# Patient Record
Sex: Male | Born: 1995 | Race: White | Hispanic: No | Marital: Single | Smoking: Light tobacco smoker
Health system: Southern US, Community
[De-identification: ages and names within clinical notes are randomized; demographics above are authoritative.]

## PROBLEM LIST (undated history)

## (undated) ENCOUNTER — Ambulatory Visit: Admission: EM | Payer: Self-pay | Source: Home / Self Care

## (undated) DIAGNOSIS — K219 Gastro-esophageal reflux disease without esophagitis: Secondary | ICD-10-CM

## (undated) DIAGNOSIS — G8929 Other chronic pain: Secondary | ICD-10-CM

## (undated) DIAGNOSIS — M549 Dorsalgia, unspecified: Secondary | ICD-10-CM

## (undated) DIAGNOSIS — B9562 Methicillin resistant Staphylococcus aureus infection as the cause of diseases classified elsewhere: Secondary | ICD-10-CM

## (undated) DIAGNOSIS — L0390 Cellulitis, unspecified: Secondary | ICD-10-CM

## (undated) HISTORY — DX: Gastro-esophageal reflux disease without esophagitis: K21.9

## (undated) HISTORY — DX: Cellulitis, unspecified: L03.90

## (undated) HISTORY — DX: Other chronic pain: G89.29

## (undated) HISTORY — DX: Dorsalgia, unspecified: M54.9

## (undated) HISTORY — DX: Methicillin resistant Staphylococcus aureus infection as the cause of diseases classified elsewhere: B95.62

---
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
## 2013-05-31 IMAGING — CR DG CHEST 2V
2 series · 2 of 2 positions shown · non-contrast
Comparison: None.

CLINICAL DATA: Chest pain

CHEST - 2 VIEW

[w chest pa]
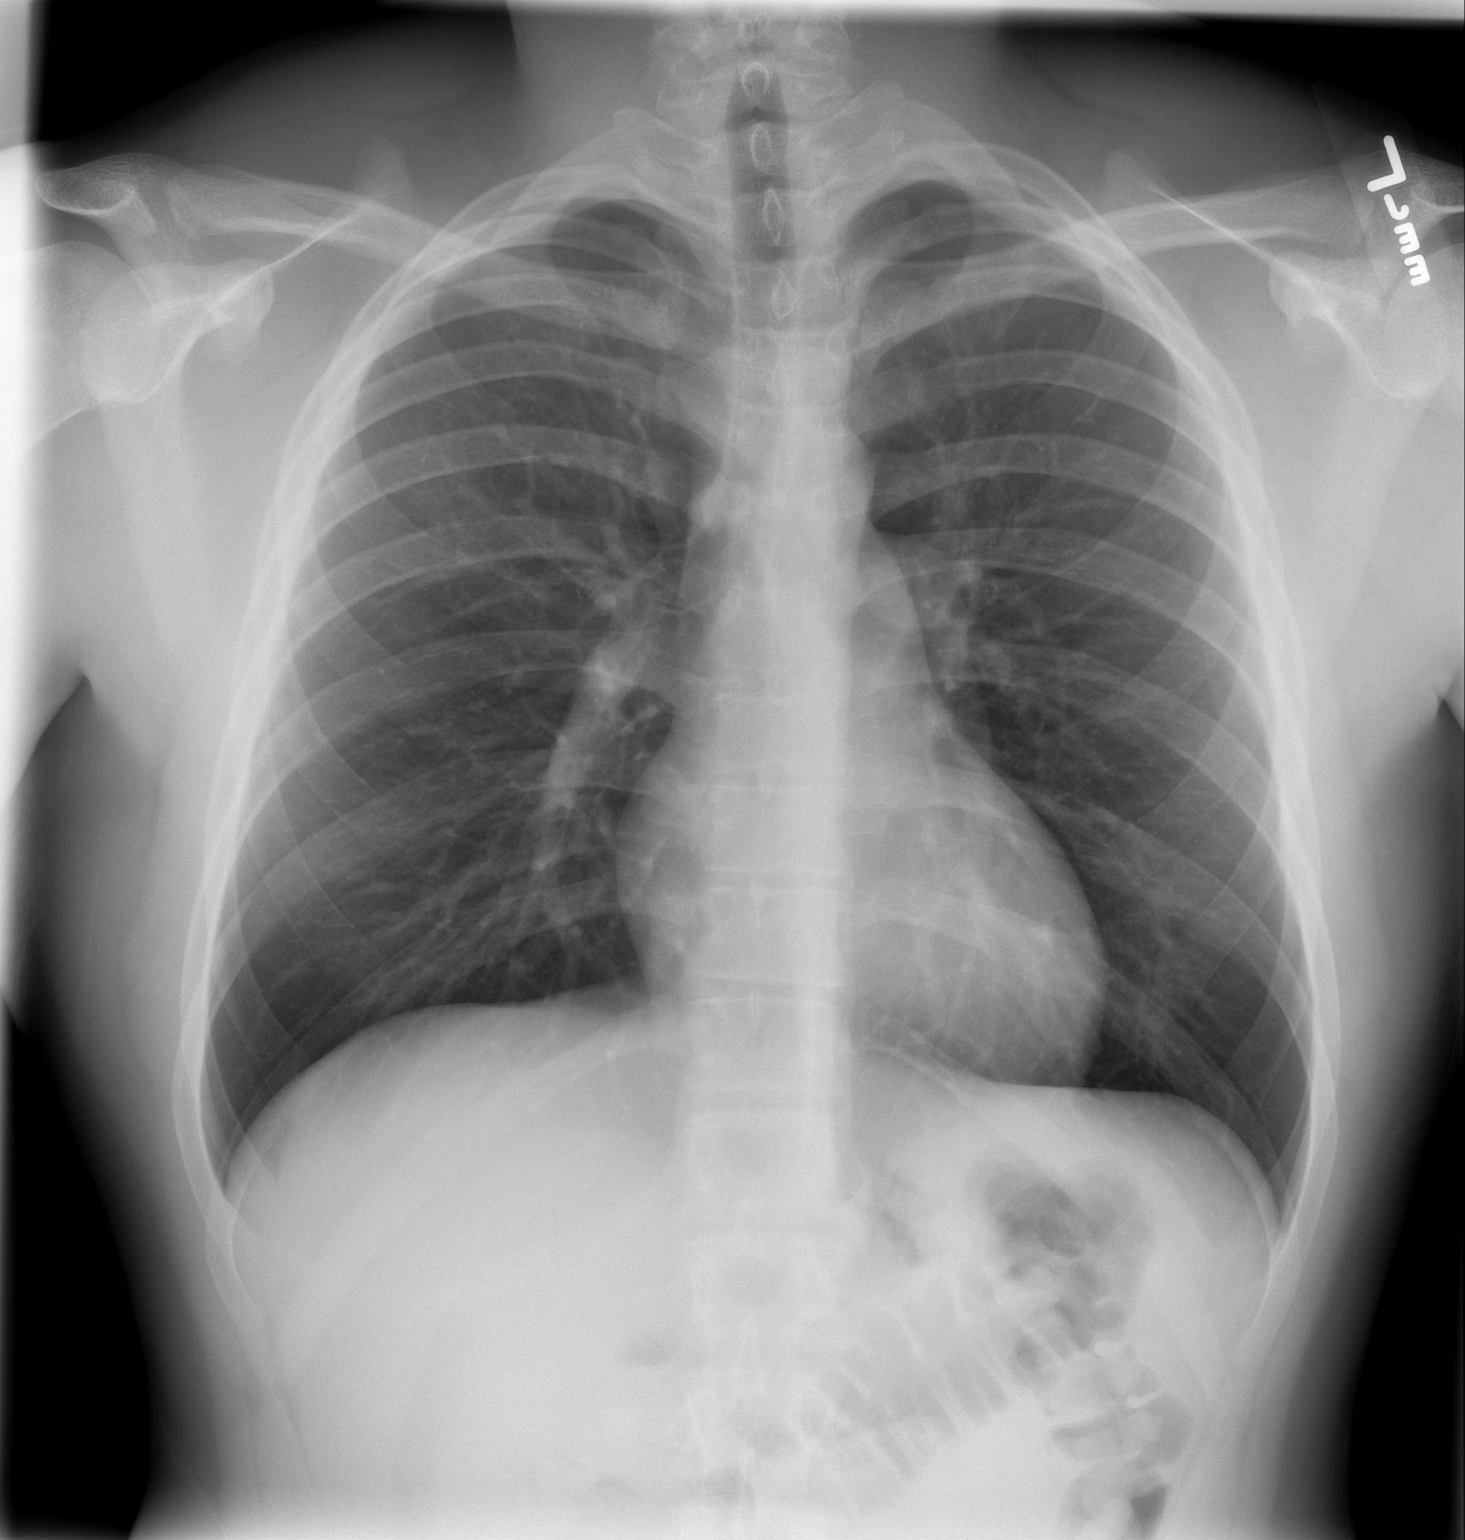

[w chest lat]
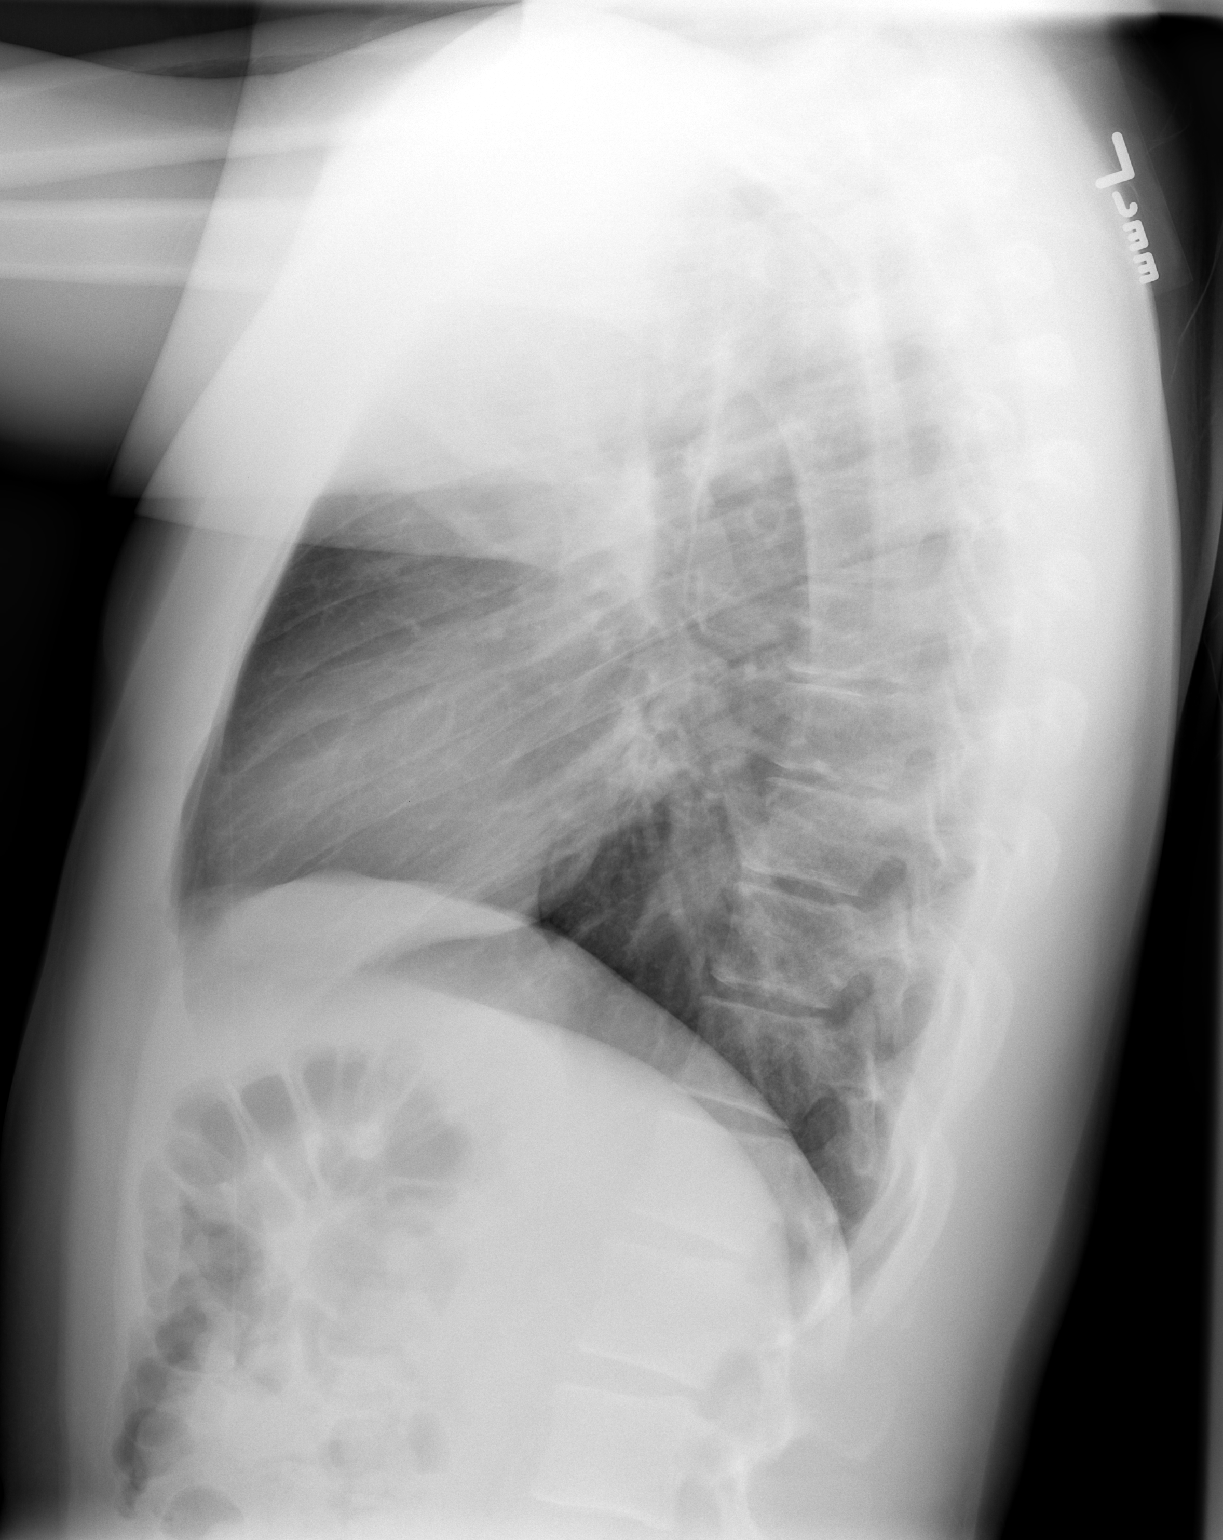

[2 of 2 positions shown; findings below may reference images not displayed]

FINDINGS: Lungs are clear. No pleural effusion or pneumothorax. The
cardiomediastinal contours are within normal limits. The visualized
bones and soft tissues are without significant appreciable
abnormality.
IMPRESSION: No radiographic evidence of acute cardiopulmonary process.

## 2013-12-06 ENCOUNTER — Emergency Department (HOSPITAL_COMMUNITY): Payer: BC Managed Care – PPO

## 2013-12-06 ENCOUNTER — Emergency Department (HOSPITAL_COMMUNITY)
Admission: EM | Admit: 2013-12-06 | Discharge: 2013-12-06 | Disposition: A | Discharge status: Home / Self Care | Payer: BC Managed Care – PPO | Attending: Emergency Medicine | Admitting: Emergency Medicine

## 2013-12-06 ENCOUNTER — Encounter (HOSPITAL_COMMUNITY): Payer: Self-pay | Admitting: Emergency Medicine

## 2013-12-06 DIAGNOSIS — R42 Dizziness and giddiness: Secondary | ICD-10-CM | POA: Insufficient documentation

## 2013-12-06 DIAGNOSIS — K219 Gastro-esophageal reflux disease without esophagitis: Secondary | ICD-10-CM | POA: Insufficient documentation

## 2013-12-06 DIAGNOSIS — R079 Chest pain, unspecified: Secondary | ICD-10-CM | POA: Insufficient documentation

## 2013-12-06 DIAGNOSIS — R1011 Right upper quadrant pain: Secondary | ICD-10-CM | POA: Insufficient documentation

## 2013-12-06 DIAGNOSIS — R110 Nausea: Secondary | ICD-10-CM | POA: Insufficient documentation

## 2013-12-06 DIAGNOSIS — F17200 Nicotine dependence, unspecified, uncomplicated: Secondary | ICD-10-CM | POA: Insufficient documentation

## 2013-12-06 DIAGNOSIS — Z79899 Other long term (current) drug therapy: Secondary | ICD-10-CM | POA: Insufficient documentation

## 2013-12-06 DIAGNOSIS — Z791 Long term (current) use of non-steroidal anti-inflammatories (NSAID): Secondary | ICD-10-CM | POA: Insufficient documentation

## 2013-12-06 DIAGNOSIS — R1013 Epigastric pain: Secondary | ICD-10-CM | POA: Insufficient documentation

## 2013-12-06 LAB — COMPREHENSIVE METABOLIC PANEL
ALK PHOS: 75 U/L (ref 39–117)
ALT: 19 U/L (ref 0–53)
AST: 16 U/L (ref 0–37)
Albumin: 3.9 g/dL (ref 3.5–5.2)
BILIRUBIN TOTAL: 0.4 mg/dL (ref 0.3–1.2)
BUN: 17 mg/dL (ref 6–23)
CALCIUM: 9 mg/dL (ref 8.4–10.5)
CHLORIDE: 107 meq/L (ref 96–112)
CO2: 24 meq/L (ref 19–32)
Creatinine, Ser: 0.81 mg/dL (ref 0.50–1.35)
GFR calc Af Amer: >90 mL/min (ref >90)
Glucose, Bld: 89 mg/dL (ref 70–99)
POTASSIUM: 4.7 meq/L (ref 3.7–5.3)
SODIUM: 141 meq/L (ref 137–147)
Total Protein: 6.6 g/dL (ref 6.0–8.3)

## 2013-12-06 LAB — CBC WITH DIFFERENTIAL/PLATELET
BASOS ABS: 0 10*3/uL (ref 0.0–0.1)
Basophils Relative: 1 % (ref 0–1)
Eosinophils Absolute: 0.1 10*3/uL (ref 0.0–0.7)
Eosinophils Relative: 3 % (ref 0–5)
HCT: 38.6 % — ABNORMAL LOW (ref 39.0–52.0)
Hemoglobin: 13.7 g/dL (ref 13.0–17.0)
LYMPHS PCT: 34 % (ref 12–46)
Lymphs Abs: 1.7 10*3/uL (ref 0.7–4.0)
MCH: 29.9 pg (ref 26.0–34.0)
MCHC: 35.5 g/dL (ref 30.0–36.0)
MCV: 84.3 fL (ref 78.0–100.0)
Monocytes Absolute: 0.4 10*3/uL (ref 0.1–1.0)
Monocytes Relative: 8 % (ref 3–12)
NEUTROS ABS: 2.7 10*3/uL (ref 1.7–7.7)
NEUTROS PCT: 54 % (ref 43–77)
PLATELETS: 276 10*3/uL (ref 150–400)
RBC: 4.58 MIL/uL (ref 4.22–5.81)
RDW: 12.3 % (ref 11.5–15.5)
WBC: 5 10*3/uL (ref 4.0–10.5)

## 2013-12-06 LAB — TROPONIN I: Troponin I: <0.3 ng/mL (ref <0.30)

## 2013-12-06 LAB — LIPASE, BLOOD: Lipase: 18 U/L (ref 11–59)

## 2013-12-06 MED ORDER — OMEPRAZOLE 20 MG PO CPDR: 20.0000 mg | DELAYED_RELEASE_CAPSULE | Freq: Every day | ORAL | Status: AC

## 2013-12-06 MED ORDER — SODIUM CHLORIDE 0.9 % IV BOLUS (SEPSIS)
1000.0000 mL | Freq: Once | INTRAVENOUS | Status: AC
  Administered 2013-12-06: 1000 mL via INTRAVENOUS

## 2013-12-06 MED ORDER — ALUM & MAG HYDROXIDE-SIMETH 200-200-20 MG/5ML PO SUSP
15.0000 mL | Freq: Once | ORAL | Status: AC
  Administered 2013-12-06: 15 mL via ORAL
  Filled 2013-12-06: qty 30

## 2013-12-06 NOTE — ED Notes (Signed)
Pt reports having mid sharp chest pains for over a week. Hx of same thing happened over a year ago and reports no diagnosis made. Denies recent cough, denies pain increasing with movement or breathing. ekg done at triage.

## 2013-12-06 NOTE — ED Notes (Signed)
To xray and u/s

## 2013-12-06 NOTE — Discharge Instructions (Signed)
Please call your doctor for a followup appointment within 24-48 hours. When you talk to your doctor please let them know that you were seen in the emergency department and have them acquire all of your records so that they can discuss the findings with you and formulate a treatment plan to fully care for your new and ongoing problems. Please call and set up an appointment with your primary care provider to be reassessed Please call and setup an appointment with cardiology-may need stress test performed since it's a second time patient is been experiencing this discomfort Please call and set up an appointment with gastroenterology-stomach doctors - regarding discomfort, for this can be due to acid reflux Please rest and stay hydrated Please avoid any spicy or greasy foods Please take medication as prescribed Please continue to monitor symptoms closely and if symptoms are to worsen or change (fever greater than 101, chills, neck pain, neck stiffness, chest pain, changes to chest pain pattern, shortness of breath, difficulty breathing, nausea, vomiting, stomach pain, diarrhea, bloody stools, black tarry stools, dizziness, confusion, headache, visual changes) please report back to emergency department immediately   Abdominal Pain, Adult Many things can cause belly (abdominal) pain. Most times, the belly pain is not dangerous. Many cases of belly pain can be watched and treated at home. HOME CARE   Do not take medicines that help you go poop (laxatives) unless told to by your doctor.  Only take medicine as told by your doctor.  Eat or drink as told by your doctor. Your doctor will tell you if you should be on a special diet. GET HELP IF:  You do not know what is causing your belly pain.  You have belly pain while you are sick to your stomach (nauseous) or have runny poop (diarrhea).  You have pain while you pee or poop.  Your belly pain wakes you up at night.  You have belly pain that gets  worse or better when you eat.  You have belly pain that gets worse when you eat fatty foods. GET HELP RIGHT AWAY IF:   The pain does not go away within 2 hours.  You have a fever.  You keep throwing up (vomiting).  The pain changes and is only in the right or left part of the belly.  You have bloody or tarry looking poop. MAKE SURE YOU:   Understand these instructions.  Will watch your condition.  Will get help right away if you are not doing well or get worse. Document Released: 04/19/2008 Document Revised: 08/22/2013 Document Reviewed: 07/11/2013 [facility] Patient Information 2014 [place], [place]. Gastroesophageal Reflux Disease, Adult Gastroesophageal reflux disease (GERD) happens when acid from your stomach flows up into the esophagus. When acid comes in contact with the esophagus, the acid causes soreness (inflammation) in the esophagus. Over time, GERD may create small holes (ulcers) in the lining of the esophagus. CAUSES   Increased body weight. This puts pressure on the stomach, making acid rise from the stomach into the esophagus.  Smoking. This increases acid production in the stomach.  Drinking alcohol. This causes decreased pressure in the lower esophageal sphincter (valve or ring of muscle between the esophagus and stomach), allowing acid from the stomach into the esophagus.  Late evening meals and a full stomach. This increases pressure and acid production in the stomach.  A malformed lower esophageal sphincter. Sometimes, no cause is found. SYMPTOMS   Burning pain in the lower part of the mid-chest behind the breastbone and  in the mid-stomach area. This may occur twice a week or more often.  Trouble swallowing.  Sore throat.  Dry cough.  Asthma-like symptoms including chest tightness, shortness of breath, or wheezing. DIAGNOSIS  Your caregiver may be able to diagnose GERD based on your symptoms. In some cases, X-rays and other tests may be done to check  for complications or to check the condition of your stomach and esophagus. TREATMENT  Your caregiver may recommend over-the-counter or prescription medicines to help decrease acid production. Ask your caregiver before starting or adding any new medicines.  HOME CARE INSTRUCTIONS   Change the factors that you can control. Ask your caregiver for guidance concerning weight loss, quitting smoking, and alcohol consumption.  Avoid foods and drinks that make your symptoms worse, such as:  Caffeine or alcoholic drinks.  Chocolate.  Peppermint or mint flavorings.  Garlic and onions.  Spicy foods.  Citrus fruits, such as oranges, lemons, or limes.  Tomato-based foods such as sauce, chili, salsa, and pizza.  Fried and fatty foods.  Avoid lying down for the 3 hours prior to your bedtime or prior to taking a nap.  Eat small, frequent meals instead of large meals.  Wear loose-fitting clothing. Do not wear anything tight around your waist that causes pressure on your stomach.  Raise the head of your bed 6 to 8 inches with wood blocks to help you sleep. Extra pillows will not help.  Only take over-the-counter or prescription medicines for pain, discomfort, or fever as directed by your caregiver.  Do not take aspirin, ibuprofen, or other nonsteroidal anti-inflammatory drugs (NSAIDs). SEEK IMMEDIATE MEDICAL CARE IF:   You have pain in your arms, neck, jaw, teeth, or back.  Your pain increases or changes in intensity or duration.  You develop nausea, vomiting, or sweating (diaphoresis).  You develop shortness of breath, or you faint.  Your vomit is green, yellow, black, or looks like coffee grounds or blood.  Your stool is red, bloody, or black. These symptoms could be signs of other problems, such as heart disease, gastric bleeding, or esophageal bleeding. MAKE SURE YOU:   Understand these instructions.  Will watch your condition.  Will get help right away if you are not doing  well or get worse. Document Released: 08/11/2005 Document Revised: 01/24/2012 Document Reviewed: 05/21/2011 [facility] Patient Information 2014 [place], [place].

## 2013-12-06 NOTE — ED Provider Notes (Signed)
CSN: [id number]     Arrival date & time 12/06/13  0756 History   First MD Initiated Contact with Patient 12/06/13 [phone]     Chief Complaint  Patient presents with  . Chest Pain   (Consider location/radiation/quality/duration/timing/severity/associated sxs/prior Treatment) The history is provided by the patient and a parent. No language interpreter was used.  Steve Mann is a 18 year old male with no known significant past medical history presenting to emergency department with mother, regarding chest pain that has been ongoing intermittently for the past 2 weeks. Patient reported that the chest pain is localized in the Center of his chest described as a sharp stabbing pain, dull aching that can last anywhere from a couple minutes to 4-5 hours. Patient reported that 2 weeks ago while driving to [place], [place] reported that he had onset of chest pain with associated dizziness. Patient reported that this episode lasted for short period of time. Denied radiation of the chest pain. Reported that he was seen by [place] at urgent care physician last week regarding similar symptoms-reported that if symptoms are to occur patient is to report back to the urgent care Center. Denied cough, shortness of breath, difficulty breathing, fever, neck pain, neck stiffness, vomiting, diarrhea, urinary symptoms, melena, petechia, headache, dizziness. Pediatrician Dr. Laural Benes [facility] pediatrics  History reviewed. No pertinent past medical history. History reviewed. No pertinent past surgical history. History reviewed. No pertinent family history. History  Substance Use Topics  . Smoking status: Light Tobacco Smoker  . Smokeless tobacco: Not on file  . Alcohol Use: Yes    Review of Systems  Constitutional: Negative for fever, chills and diaphoresis.  Eyes: Negative for visual disturbance.  Respiratory: Negative for cough, chest tightness and shortness of breath.   Cardiovascular: Positive for chest pain.    Gastrointestinal: Positive for nausea and abdominal pain. Negative for vomiting, diarrhea, constipation, blood in stool and anal bleeding.  Musculoskeletal: Negative for back pain and neck pain.  Neurological: Positive for dizziness. Negative for weakness.  All other systems reviewed and are negative.    Allergies  Sulfa antibiotics  Home Medications   Current Outpatient Rx  Name  Route  Sig  Dispense  Refill  . ibuprofen (ADVIL,MOTRIN) 200 MG tablet   Oral   Take 400 mg by mouth 3 (three) times daily as needed (pain).          . naproxen (NAPROSYN) 500 MG tablet   Oral   Take 500 mg by mouth 2 (two) times daily.         Marland Kitchen omeprazole (PRILOSEC) 20 MG capsule   Oral   Take 1 capsule (20 mg total) by mouth daily.   10 capsule   0    BP 129/73  Pulse 60  Temp(Src) 97.9 F (36.6 C) (Oral)  Resp 19  SpO2 99% Physical Exam  Nursing note and vitals reviewed. Constitutional: He is oriented to person, place, and time. He appears well-developed and well-nourished. No distress.  HENT:  Head: Normocephalic and atraumatic.  Eyes: Conjunctivae and EOM are normal. Pupils are equal, round, and reactive to light. Right eye exhibits no discharge. Left eye exhibits no discharge.  Neck: Normal range of motion. Neck supple. No tracheal deviation present.  Cardiovascular: Normal rate, regular rhythm and normal heart sounds.   Pulses:      Radial pulses are 2+ on the right side, and 2+ on the left side.       Dorsalis pedis pulses are 2+ on the right  side, and 2+ on the left side.  Pulmonary/Chest: Effort normal and breath sounds normal. No respiratory distress. He has no wheezes. He has no rales. He exhibits no tenderness.  Abdominal: Soft. Normal appearance and bowel sounds are normal. There is tenderness in the right upper quadrant and epigastric area. There is positive Murphy's sign. There is no guarding and no tenderness at McBurney's point.    Discomfort upon palpation to the  epigastric, right upper quadrant and left upper quadrant Positive Murphy's sign identified Negative pain upon palpation to the lower quadrants-negative McBurney's point, negative Rovsing sign  Musculoskeletal: Normal range of motion.  Full ROM to upper and lower extremities without difficulty noted, negative ataxia noted.  Lymphadenopathy:    He has no cervical adenopathy.  Neurological: He is alert and oriented to person, place, and time. No cranial nerve deficit. He exhibits normal muscle tone. Coordination normal.  Cranial nerves III-XII grossly intact Strength 5+/5+ to upper and lower extremities bilaterally with resistance applied, equal distribution noted  Skin: Skin is warm and dry. No rash noted. He is not diaphoretic. No erythema.  Psychiatric: He has a normal mood and affect. His behavior is normal. Thought content normal.    ED Course  Procedures (including critical care time)  This provider reviewed patient's chart. Patient assessed in the emergency department on 10/03/2012 regarding chest pain. Patient was diagnosed with right upper quadrant, abdominal pain. CBC, CMP, chest x-ray were negative for acute findings.  11:23 AM This provider re-assessed patient. Patient reported that he does not have chest pain, reported that his pain is controlled. Discussed labs and imaging results in great detail with mother and patient. Discussed plan for discharge - patient and mother agreed to plan of care and understood. All questions answered.  Results for orders placed during the hospital encounter of 12/06/13  CBC WITH DIFFERENTIAL      Result Value Range   WBC 5.0  4.0 - 10.5 K/uL   RBC 4.58  4.22 - 5.81 MIL/uL   Hemoglobin 13.7  13.0 - 17.0 g/dL   HCT 65.7 (*) 84.6 - 96.2 %   MCV 84.3  78.0 - 100.0 fL   MCH 29.9  26.0 - 34.0 pg   MCHC 35.5  30.0 - 36.0 g/dL   RDW 95.2  84.1 - 32.4 %   Platelets 276  150 - 400 K/uL   Neutrophils Relative % 54  43 - 77 %   Neutro Abs 2.7  1.7 - 7.7  K/uL   Lymphocytes Relative 34  12 - 46 %   Lymphs Abs 1.7  0.7 - 4.0 K/uL   Monocytes Relative 8  3 - 12 %   Monocytes Absolute 0.4  0.1 - 1.0 K/uL   Eosinophils Relative 3  0 - 5 %   Eosinophils Absolute 0.1  0.0 - 0.7 K/uL   Basophils Relative 1  0 - 1 %   Basophils Absolute 0.0  0.0 - 0.1 K/uL  COMPREHENSIVE METABOLIC PANEL      Result Value Range   Sodium 141  137 - 147 mEq/L   Potassium 4.7  3.7 - 5.3 mEq/L   Chloride 107  96 - 112 mEq/L   CO2 24  19 - 32 mEq/L   Glucose, Bld 89  70 - 99 mg/dL   BUN 17  6 - 23 mg/dL   Creatinine, Ser 4.01  0.50 - 1.35 mg/dL   Calcium 9.0  8.4 - 02.7 mg/dL   Total Protein 6.6  6.0 - 8.3 g/dL   Albumin 3.9  3.5 - 5.2 g/dL   AST 16  0 - 37 U/L   ALT 19  0 - 53 U/L   Alkaline Phosphatase 75  39 - 117 U/L   Total Bilirubin 0.4  0.3 - 1.2 mg/dL   GFR calc non Af Amer >90  >90 mL/min   GFR calc Af Amer >90  >90 mL/min  TROPONIN I      Result Value Range   Troponin I <0.30  <0.30 ng/mL  LIPASE, BLOOD      Result Value Range   Lipase 18  11 - 59 U/L   Results for orders placed during the hospital encounter of 12/06/13  CBC WITH DIFFERENTIAL      Result Value Range   WBC 5.0  4.0 - 10.5 K/uL   RBC 4.58  4.22 - 5.81 MIL/uL   Hemoglobin 13.7  13.0 - 17.0 g/dL   HCT 16.1 (*) 09.6 - 04.5 %   MCV 84.3  78.0 - 100.0 fL   MCH 29.9  26.0 - 34.0 pg   MCHC 35.5  30.0 - 36.0 g/dL   RDW 40.9  81.1 - 91.4 %   Platelets 276  150 - 400 K/uL   Neutrophils Relative % 54  43 - 77 %   Neutro Abs 2.7  1.7 - 7.7 K/uL   Lymphocytes Relative 34  12 - 46 %   Lymphs Abs 1.7  0.7 - 4.0 K/uL   Monocytes Relative 8  3 - 12 %   Monocytes Absolute 0.4  0.1 - 1.0 K/uL   Eosinophils Relative 3  0 - 5 %   Eosinophils Absolute 0.1  0.0 - 0.7 K/uL   Basophils Relative 1  0 - 1 %   Basophils Absolute 0.0  0.0 - 0.1 K/uL  COMPREHENSIVE METABOLIC PANEL      Result Value Range   Sodium 141  137 - 147 mEq/L   Potassium 4.7  3.7 - 5.3 mEq/L   Chloride 107  96 - 112  mEq/L   CO2 24  19 - 32 mEq/L   Glucose, Bld 89  70 - 99 mg/dL   BUN 17  6 - 23 mg/dL   Creatinine, Ser 7.82  0.50 - 1.35 mg/dL   Calcium 9.0  8.4 - 95.6 mg/dL   Total Protein 6.6  6.0 - 8.3 g/dL   Albumin 3.9  3.5 - 5.2 g/dL   AST 16  0 - 37 U/L   ALT 19  0 - 53 U/L   Alkaline Phosphatase 75  39 - 117 U/L   Total Bilirubin 0.4  0.3 - 1.2 mg/dL   GFR calc non Af Amer >90  >90 mL/min   GFR calc Af Amer >90  >90 mL/min  TROPONIN I      Result Value Range   Troponin I <0.30  <0.30 ng/mL  LIPASE, BLOOD      Result Value Range   Lipase 18  11 - 59 U/L   Dg Chest 2 View  12/06/2013   CLINICAL DATA:  Chest pain  EXAM: CHEST  2 VIEW  COMPARISON:  October 03, 2012  FINDINGS: Lungs are clear. Heart size and pulmonary vascularity are normal. No adenopathy. There is mild upper thoracic levoscoliosis. No fractures appreciable. No pneumothorax.  IMPRESSION: No edema or consolidation.   Electronically Signed   By: Bretta Bang M.D.   On: 12/06/2013 09:35   US Abdomen Limited  Ruq  12/06/2013   CLINICAL DATA:  Right upper quadrant pain  EXAM: US ABDOMEN LIMITED - RIGHT UPPER QUADRANT  COMPARISON:  None.  FINDINGS: Gallbladder:  No gallstones or wall thickening visualized. There is no pericholecystic fluid. No sonographic Murphy sign noted.  Common bile duct:  Diameter: 3 mm. There is no intrahepatic or extrahepatic biliary duct dilatation.  Liver:  No focal lesion identified. Within normal limits in parenchymal echogenicity.  IMPRESSION: Study within normal limits.   Electronically Signed   By: Bretta Bang M.D.   On: 12/06/2013 10:00   Labs Review Labs Reviewed  CBC WITH DIFFERENTIAL - Abnormal; Notable for the following:    HCT 38.6 (*)    All other components within normal limits  COMPREHENSIVE METABOLIC PANEL  TROPONIN I  LIPASE, BLOOD   Imaging Review Dg Chest 2 View  12/06/2013   CLINICAL DATA:  Chest pain  EXAM: CHEST  2 VIEW  COMPARISON:  October 03, 2012  FINDINGS: Lungs are  clear. Heart size and pulmonary vascularity are normal. No adenopathy. There is mild upper thoracic levoscoliosis. No fractures appreciable. No pneumothorax.  IMPRESSION: No edema or consolidation.   Electronically Signed   By: Bretta Bang M.D.   On: 12/06/2013 09:35   US Abdomen Limited Ruq  12/06/2013   CLINICAL DATA:  Right upper quadrant pain  EXAM: US ABDOMEN LIMITED - RIGHT UPPER QUADRANT  COMPARISON:  None.  FINDINGS: Gallbladder:  No gallstones or wall thickening visualized. There is no pericholecystic fluid. No sonographic Murphy sign noted.  Common bile duct:  Diameter: 3 mm. There is no intrahepatic or extrahepatic biliary duct dilatation.  Liver:  No focal lesion identified. Within normal limits in parenchymal echogenicity.  IMPRESSION: Study within normal limits.   Electronically Signed   By: Bretta Bang M.D.   On: 12/06/2013 10:00    EKG Interpretation    Date/Time:  Thursday December 06 2013 07:59:02 EST Ventricular Rate:  64 PR Interval:  126 QRS Duration: 86 QT Interval:  382 QTC Calculation: 394 R Axis:   84 Text Interpretation:  Normal sinus rhythm Normal ECG Sinus rhythm Normal ECG Confirmed by Gerhard Munch  MD ([id number]) on 12/06/2013 8:09:19 AM            MDM   1. Chest pain   2. RUQ abdominal pain   3. Epigastric pain   4. Acid reflux     Medications  sodium chloride 0.9 % bolus 1,000 mL (0 mLs Intravenous Stopped 12/06/13 1202)  alum & mag hydroxide-simeth (MAALOX/MYLANTA) 200-200-20 MG/5ML suspension 15 mL (15 mLs Oral Given 12/06/13 1151)   Filed Vitals:   12/06/13 0900 12/06/13 1144  BP: 122/60 129/73  Pulse: 54 60  Temp: 98 F (36.7 C) 97.9 F (36.6 C)  TempSrc: Oral Oral  Resp: 21 19  SpO2: 100% 99%    Patient presenting to emergency department with chest pain that has been ongoing intermittently for the past 2 weeks. Described chest pain as a stabbing, aching dull sensation localized to the Center the chest without  radiation-episodes can last anywhere from 4-5 hours to a couple of minutes. Stated that there is no trend seen. Stated that he was seen and assessed last year regarding similar symptoms, reported that this has been a long time since this episode is occurred. Alert and oriented. GCS 15. Heart rate and rhythm normal. Radial and DP pulses 2+ bilaterally. Lungs clear to auscultation. Full range of motion to upper and lower extremities bilaterally.  Cap refill less than 3 seconds. Negative swelling to bilateral lower extremities, pitting edema. Negative neurological deficits identified. Strength intact, equal grip strength noted bilaterally. EKG noted normal sinus rhythm with a heart rate of 64 beats per minute - negative ischemic findings identified. CBC negative findings. CMP negative findings. Lipase negative elevation. Troponin negative elevation. Chest x-ray negative findings ultrasound of right upper quadrant negative for acute cholecystitis, negative findings for gallstones. Negative findings for cholecystitis. Negative findings for cholelithiasis. Negative findings for pneumonia or acute infection. Abdominal exam negative for acute abdomen-nonsurgical abdomen. PERC score 0 - doubt PE. Based on patient's age, has decreased risk factors for cardiac issue - doubt cardiac issue. Suspicion to be possible acid reflux. Definitive etiology of chest pain unknown. Discussed case with attending physician, Dr. Elvera Lennox. [place]-cleared patient for discharge. Patient stable, afebrile. Discharged patient. Discharged patient with PPI. Discussed with patient proper diet. Referred patient to primary care provider, cardiologist, gastroenterologist. Discussed with patient to rest and stay hydrated. Discussed with patient to closely monitor symptoms and if symptoms are to worsen or change to report back to the ED - strict return instructions given.  Patient agreed to plan of care, understood, all questions answered.   Raymon MuttonMarissa  Ameya Vowell, PA-C 12/06/13 2131

## 2013-12-06 NOTE — ED Notes (Signed)
Cp that started last year went away and  Then he woke up w/ it this am midsternal had some nausea . Had some back pain recently  No rads to neck arm shoulder  No sweating

## 2013-12-11 ENCOUNTER — Encounter: Payer: Self-pay | Admitting: Cardiology

## 2013-12-11 ENCOUNTER — Ambulatory Visit (INDEPENDENT_AMBULATORY_CARE_PROVIDER_SITE_OTHER): Payer: BC Managed Care – PPO | Admitting: Cardiology

## 2013-12-11 VITALS — BP 120/72 | HR 61 | Ht 69.8 in | Wt 197.5 lb

## 2013-12-11 DIAGNOSIS — R079 Chest pain, unspecified: Secondary | ICD-10-CM

## 2013-12-11 NOTE — ED Provider Notes (Signed)
  Medical screening examination/treatment/procedure(s) were performed by non-physician practitioner and as supervising physician I was immediately available for consultation/collaboration.  EKG Interpretation    Date/Time:  Thursday December 06 2013 07:59:02 EST Ventricular Rate:  64 PR Interval:  126 QRS Duration: 86 QT Interval:  382 QTC Calculation: 394 R Axis:   84 Text Interpretation:  Normal sinus rhythm Normal ECG Sinus rhythm Normal ECG Confirmed by Gerhard MunchLOCKWOOD, Ezelle Surprenant  MD ([id number]) on 12/06/2013 8:09:19 AM               Gerhard Munchobert Kalliopi Coupland, MD 12/11/13 1120

## 2013-12-11 NOTE — Patient Instructions (Signed)
I believe your chest pain is musculoskeletal and not related to your heart. Please take motrin as discussed for chest pain. If pain returns, please call the office and we can discuss doing a stress test.  Call or return to clinic prn if these symptoms worsen or fail to improve as anticipated.
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
## 2013-12-11 NOTE — Assessment & Plan Note (Signed)
His symptoms are very atypical for anything cardiac. It was alleviated with ibuprofen, and not so much with the PPI. He has had trauma to the chest in the past, my leading diagnosis would be musculoskeletal chest pain. I do not see that we were having benefit from doing a treadmill stress test or an echocardiogram on this otherwise healthy young man. Him and his mother. I recommended using high-dose Cipro for a for a couple days a time, and she really doesn't with food and plan of hydration the symptoms would recur. It does not sound at all like pericarditis.  If he has recurrence of symptoms or if there will worsening with exertion, be happy to then perform ischemic evaluation, however with no family history I don't think there is any reason to do a stress test.

## 2014-08-03 IMAGING — US US ABDOMEN LIMITED
1 series · 14 of 25 positions shown · non-contrast
Comparison: None.

CLINICAL DATA: Right upper quadrant pain

EXAM:
US ABDOMEN LIMITED - RIGHT UPPER QUADRANT

[Series 1: us abdomen limited · 0.27mm/px · 14 of 37 slices shown]
[im 1/37]
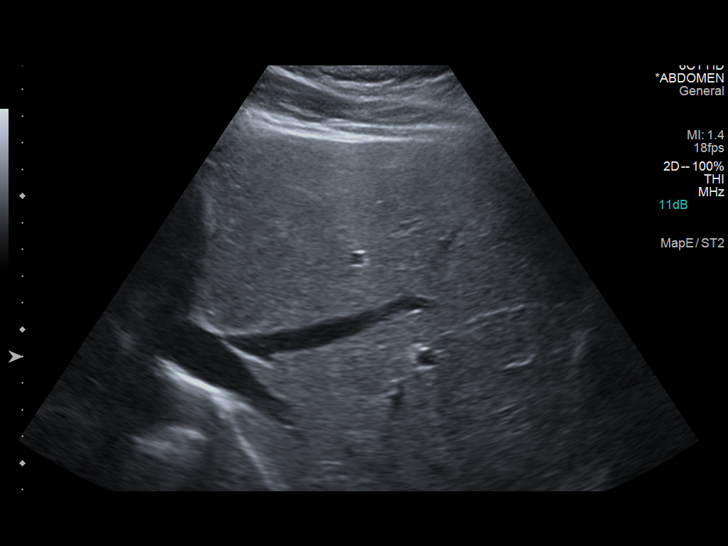
[im 4/37]
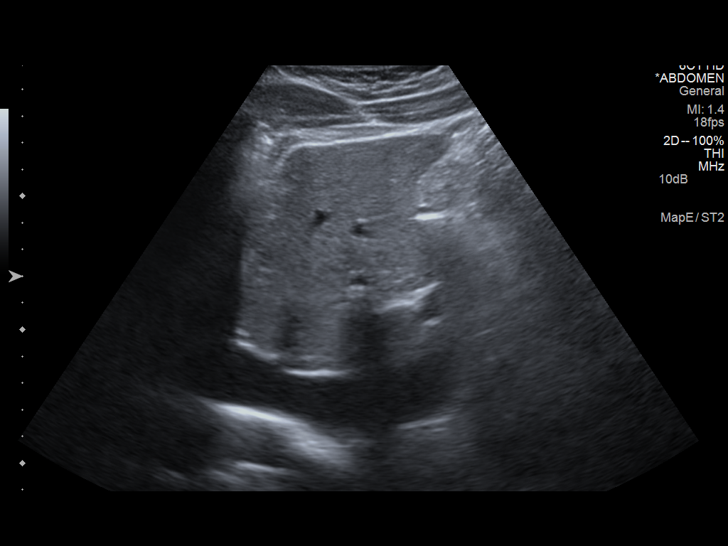
[im 7/37]
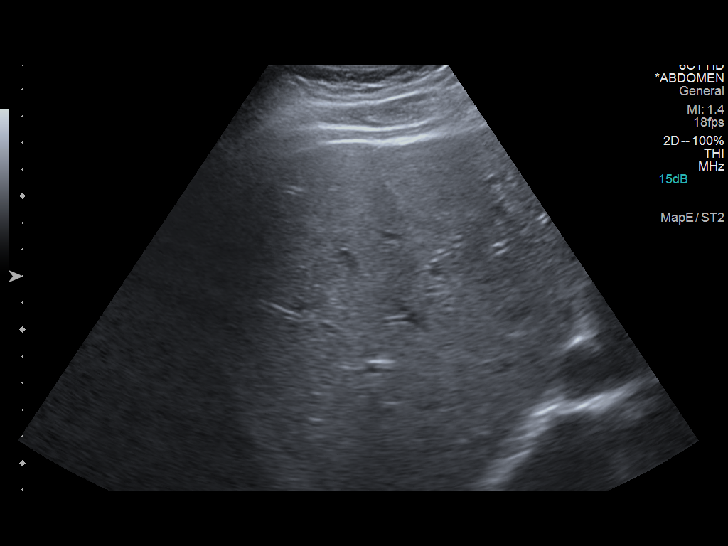
[im 10/37]
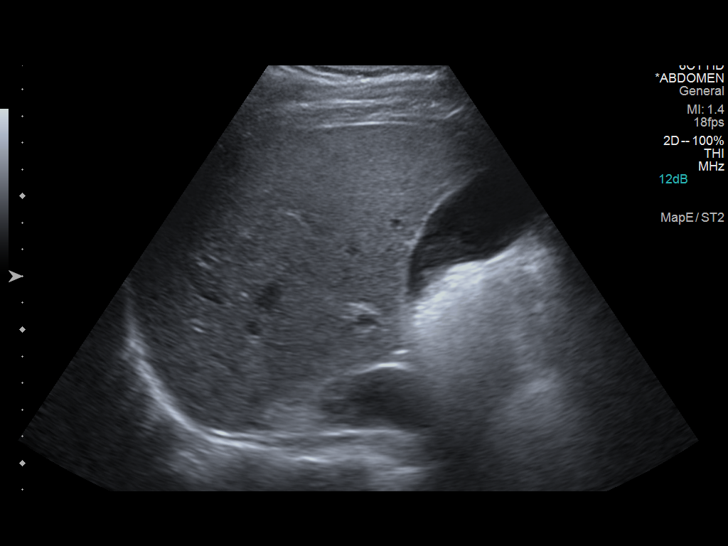
[im 13/37]
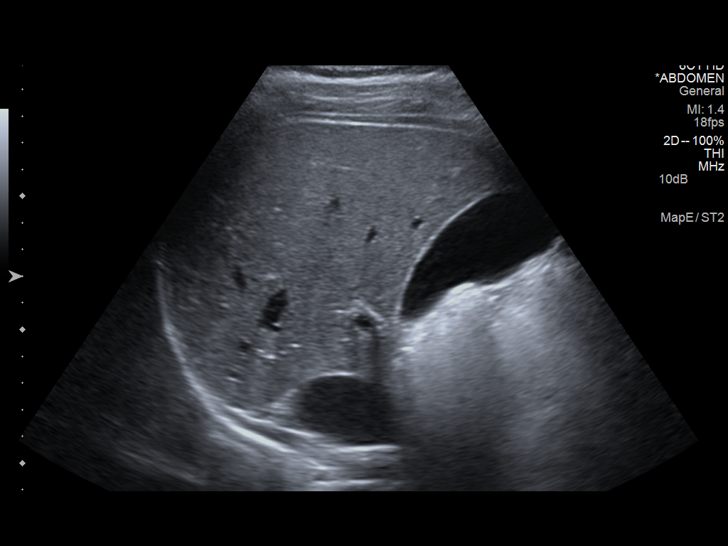
[im 14/37]
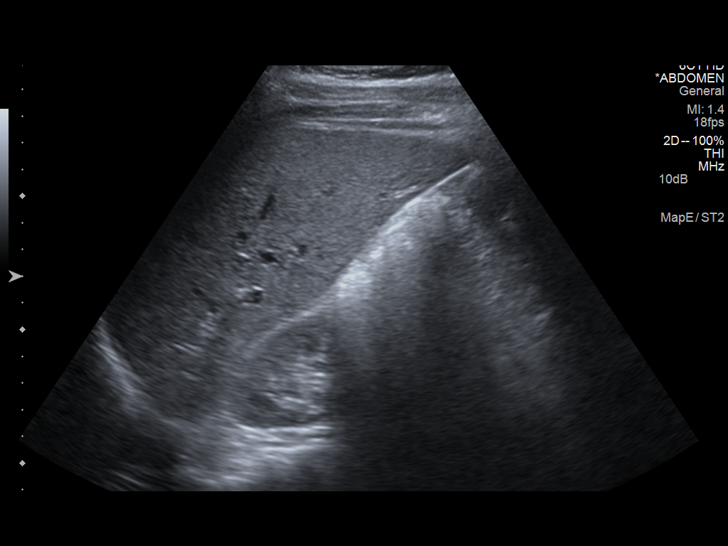
[im 17/37]
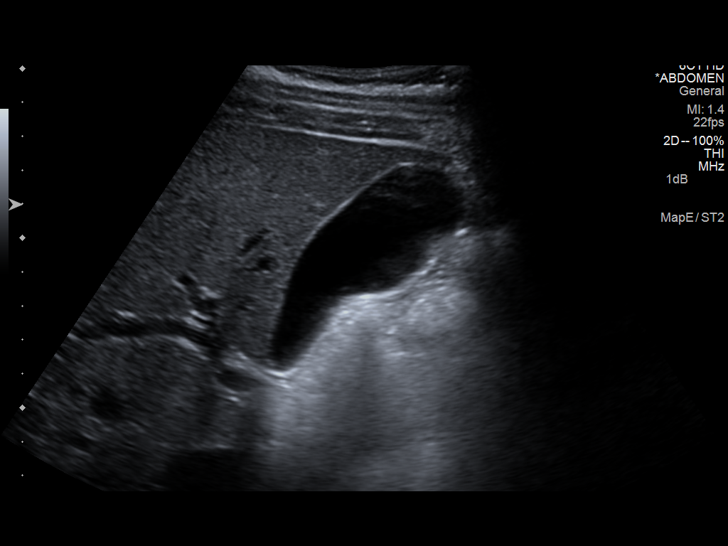
[im 20/37]
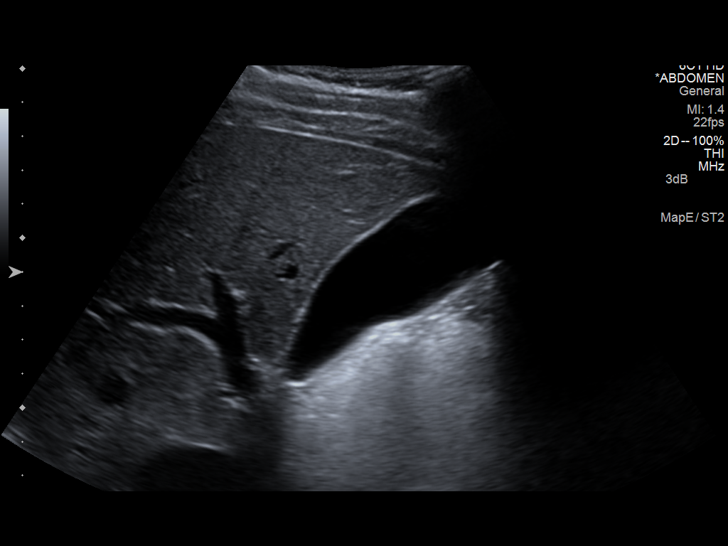
[im 23/37]
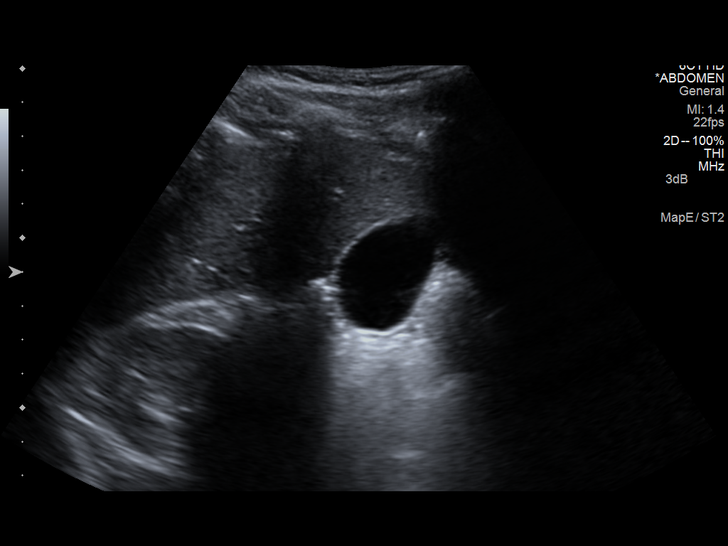
[im 25/37]
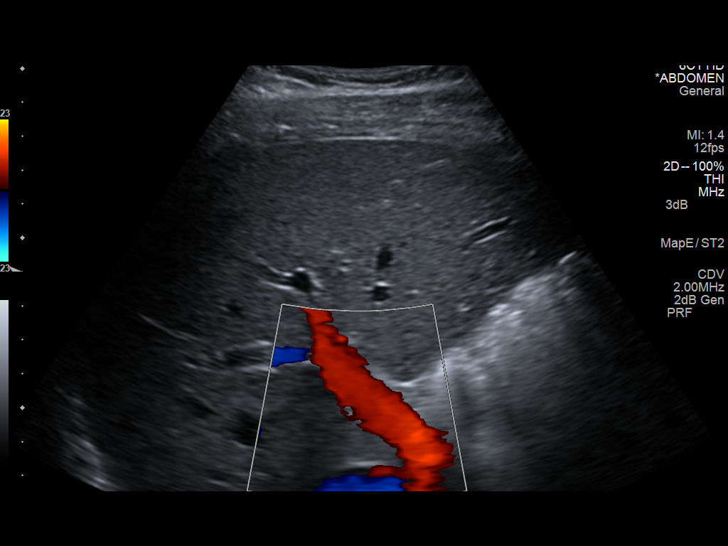
[im 28/37]
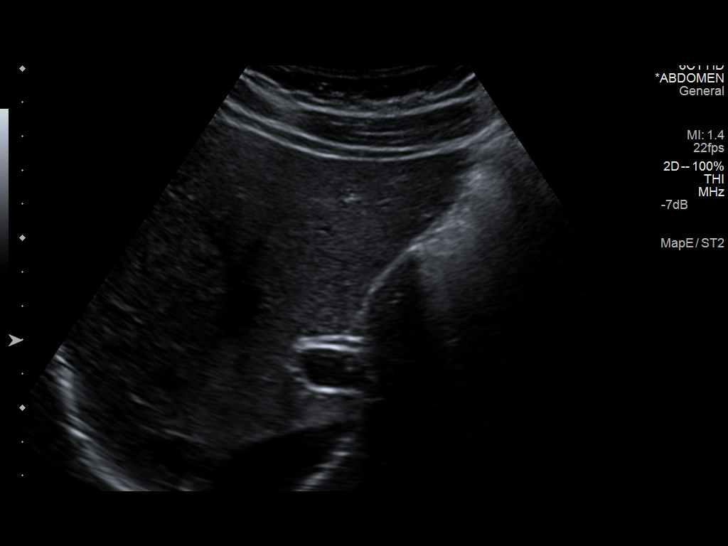
[im 31/37]
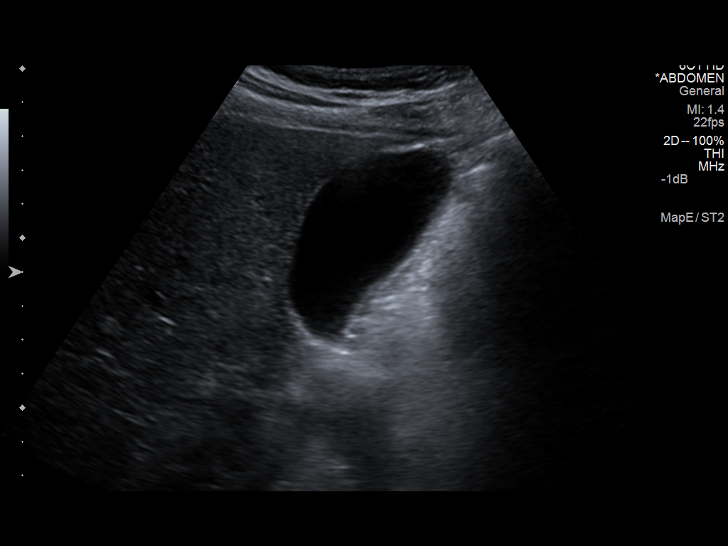
[im 34/37]
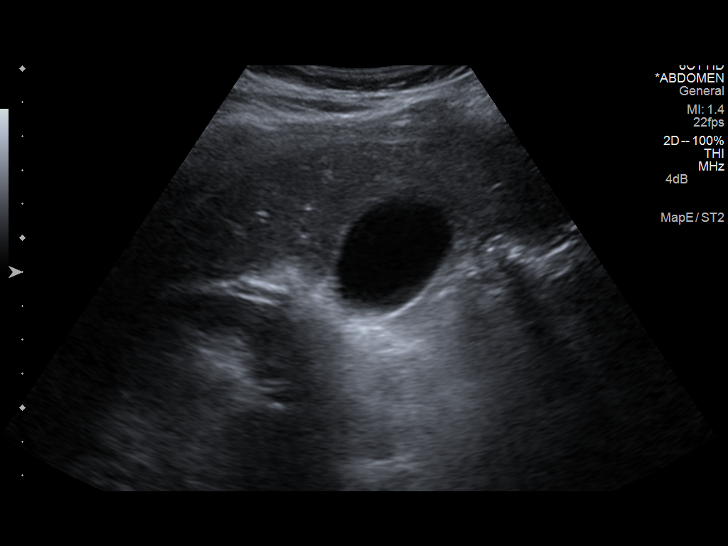
[im 37/37]
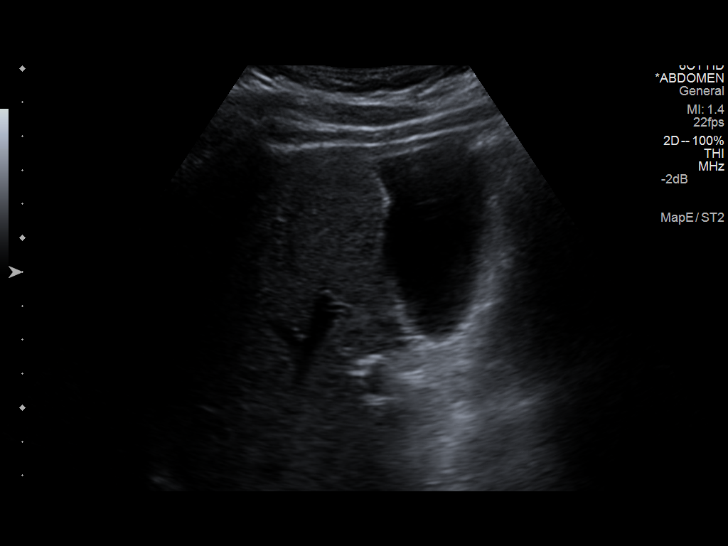

[14 of 25 positions shown; findings below may reference images not displayed]

FINDINGS: Gallbladder:

No gallstones or wall thickening visualized. There is no
pericholecystic fluid. No sonographic Murphy sign noted.

Common bile duct:

Diameter: 3 mm. There is no intrahepatic or extrahepatic biliary
duct dilatation.

Liver:

No focal lesion identified. Within normal limits in parenchymal
echogenicity.
IMPRESSION: Study within normal limits.
